# Patient Record
Sex: Female | Born: 1970
Health system: Southern US, Community
[De-identification: ages and names within clinical notes are randomized; demographics above are authoritative.]

## PROBLEM LIST (undated history)

## (undated) DIAGNOSIS — D649 Anemia, unspecified: Secondary | ICD-10-CM

## (undated) DIAGNOSIS — E282 Polycystic ovarian syndrome: Secondary | ICD-10-CM

## (undated) DIAGNOSIS — G43909 Migraine, unspecified, not intractable, without status migrainosus: Secondary | ICD-10-CM

---

## 2002-08-06 ENCOUNTER — Ambulatory Visit: Admission: RE | Admit: 2002-08-06 | Discharge: 2002-08-06 | Payer: Self-pay | Admitting: Gynecology

## 2002-08-18 ENCOUNTER — Ambulatory Visit (HOSPITAL_COMMUNITY): Admission: RE | Admit: 2002-08-18 | Discharge: 2002-08-18 | Payer: Self-pay | Admitting: Gastroenterology

## 2002-09-17 ENCOUNTER — Ambulatory Visit (HOSPITAL_COMMUNITY): Admission: RE | Admit: 2002-09-17 | Discharge: 2002-09-17 | Payer: Self-pay | Admitting: Gynecology

## 2002-09-17 ENCOUNTER — Encounter (INDEPENDENT_AMBULATORY_CARE_PROVIDER_SITE_OTHER): Payer: Self-pay | Admitting: *Deleted

## 2002-10-01 ENCOUNTER — Ambulatory Visit: Admission: RE | Admit: 2002-10-01 | Discharge: 2002-10-01 | Payer: Self-pay | Admitting: Gynecology

## 2002-10-14 ENCOUNTER — Ambulatory Visit: Admission: RE | Admit: 2002-10-14 | Discharge: 2002-10-14 | Payer: Self-pay | Admitting: Gynecology

## 2002-11-11 ENCOUNTER — Ambulatory Visit: Admission: RE | Admit: 2002-11-11 | Discharge: 2002-11-11 | Payer: Self-pay | Admitting: Gynecology

## 2010-11-05 ENCOUNTER — Emergency Department (HOSPITAL_BASED_OUTPATIENT_CLINIC_OR_DEPARTMENT_OTHER)
Admission: EM | Admit: 2010-11-05 | Discharge: 2010-11-05 | Disposition: A | Discharge status: Home / Self Care | Payer: Managed Care, Other (non HMO) | Attending: Emergency Medicine | Admitting: Emergency Medicine

## 2010-11-05 DIAGNOSIS — N390 Urinary tract infection, site not specified: Secondary | ICD-10-CM | POA: Insufficient documentation

## 2010-11-05 DIAGNOSIS — K589 Irritable bowel syndrome without diarrhea: Secondary | ICD-10-CM | POA: Insufficient documentation

## 2010-11-05 LAB — URINE MICROSCOPIC-ADD ON

## 2010-11-05 LAB — URINALYSIS, ROUTINE W REFLEX MICROSCOPIC
Hgb urine dipstick: NEGATIVE
Ketones, ur: NEGATIVE mg/dL
Protein, ur: NEGATIVE mg/dL
Urine Glucose, Fasting: NEGATIVE mg/dL
pH: 6.5 (ref 5.0–8.0)

## 2010-11-07 LAB — URINE CULTURE: Culture  Setup Time: 201202182233

## 2011-02-03 NOTE — Consult Note (Signed)
Victoria Salazar, Victoria Salazar                    ACCOUNT NO.:  [id number]   MEDICAL RECORD NO.:  [id number]                   PATIENT TYPE:  OUT   LOCATION:  GYN                                  FACILITY:  [facility]   PHYSICIAN:  De Blanch, M.D.         DATE OF BIRTH:  Feb 06, 1971   DATE OF CONSULTATION:  08/06/2002  DATE OF DISCHARGE:                                   CONSULTATION   REASON FOR CONSULTATION:  A 40 year old, white, married female seen in  consultation at the request of Dr. Clint Lipps of [place], [place].   The patient had a vaginal delivery of a 9 pound infant on April 03, 2002. At  that time, she had a fourth degree laceration which was repaired in the  standard fashion. Subsequently at her six week postpartum visit, however,  the patient noted passage of gas from her vagina and occasional small amount  of stool. This has persisted to the present time despite conservative  management in hopes of having this heal spontaneously. The patient presents  today for consultation regarding management options.   Currently the patient does leak flatus from the vagina and also leaks liquid  stool. Unfortunately she has irritable bowel syndrome and currently has a  considerable amount of diarrhea. The diarrhea was even worse during the  pregnancy. She does have a past history of undergoing an evaluation by a  gastroenterologist approximately five years ago where a diagnosis of  irritable bowel syndrome was made.   The patient denies any past gynecologic history and denies any diagnosis of  Crohns disease or ulcerative colitis.   PAST SURGICAL HISTORY:  None.   ALLERGIES:  SULFA.   CURRENT MEDICATIONS:  Oral contraceptives.   FAMILY HISTORY:  Negative for breast, ovarian or colon cancer.   GYNECOLOGIC HISTORY:  Gravida 1.   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 She does not smoke, drinks alcohol  on occasion.   REVIEW OF SYMPTOMS:  Negative except as noted above. The patient  specifically denies any abdominal pain or pressure, fever or chills,  cardiovascular, pulmonary, musculoskeletal, or GU symptoms.   PHYSICAL EXAMINATION:  Height 5 foot 4, weight 134 pounds. GENERAL:  The  patient is a healthy white female in no acute distress. HEENT:  NEGATIVE.  NECK:  Supple without thyromegaly. There is no supraclavicular or inguinal  adenopathy. ABDOMEN:  Soft, nontender, no mass, organomegaly, ascites, or  hernias are noted. PELVIC;  EGBUS is normal. The vagina has liquid stool  within it. The cervix is parous and normal. The uterus is anterior, normal  shape, size and consistency. There are no adnexal masses noted.   Careful inspection of the posterior vagina reveals that the rectovaginal  septum is relatively thin. With retraction of the anterior vagina and  visualization of the posterior vagina, a 2-3 mm fistula tract is noted  approximately 2 cm from  the introitus. A wire probe is easily passed through  this tract. The anal sphincter seems to be intact.   IMPRESSION:  Rectovaginal fistula following obstetrical laceration.   PLAN:  I would recommend the patient undergo repeat gastroenterology  consultation to rule out Crohns disease and ulcerative colitis. It these  were excluded then I think proceeding with surgical repair would be very  appropriate. The risks of surgery including bleeding, infection, and  breakdown of the repair with repeat fistulization were all outlined to the  patient. The surgical technique options were also outlined and depending  upon ease of access, we may or may not require transection of the anal  sphincter. Clearly at the time of repair, reinforcement and building up of  the rectovaginal septum will be important. We will arrange for the patient  to have GI consultation and then follow-up thereafter.                                                De Blanch, M.D.    DC/MEDQ  D:  08/06/2002  T:  08/06/2002  Job:  [id number]   cc:   Clint Lipps, M.D.  [facility]  [address] [place], [place]. [address]   Telford Nab, R.N.  [address] [place], [place] [address]  Fax: 1

## 2011-02-03 NOTE — Consult Note (Signed)
   NAMEBAO, COREAS                    ACCOUNT NO.:  [id number]   MEDICAL RECORD NO.:  [id number]                   PATIENT TYPE:  OUT   LOCATION:  GYN                                  FACILITY:  [facility]   PHYSICIAN:  De Blanch, M.D.         DATE OF BIRTH:  11-20-1970   DATE OF CONSULTATION:  10/01/2002  DATE OF DISCHARGE:                                   CONSULTATION   A 40 year old white female returns for initial postoperative follow-up now  approximately two weeks since repair of rectovaginal fistula and  reconstruction of a disrupted anal sphincter.   The patient reports that overall she has done well.  She is more active in  the last two days and has slight increased amount of perianal pain.  Her  bowel movements are fine.  She is having no constipation.  She has total  continence of stool and flatus.  She has returned to basically her standard  level of activity except for exercise.  She denies any fever or chills or  any vaginal or rectal drainage, discharge, or bleeding.   PHYSICAL EXAMINATION:  ABDOMEN:  Soft, nontender.  No mass, organomegaly,  ascites, or hernias are noted.  PELVIC:  EGBUS shows the midline perineal incision is well healed.  There is  no granulation tissue.  Inspection of the posterior vaginal wall shows some  suture material still intact.  There is no breakdown.  Palpation of the  posterior vagina reveals postoperative induration which seems appropriate at  this juncture without any point tenderness.  Rectal examination is deferred  at this point.   IMPRESSION:  Good postoperative recovery now two weeks following repair.  The patient will continue taking stool softeners for at least the next  month.  She is encouraged to use sitz baths a little more often to help  relieve some of the discomfort in the anal area.   I had a lengthy discussion with the patient regarding the procedure that we  performed, outlining both the repair of  the fistula as well as the anal  sphincter.  All of her questions are answered.  She will return to see me in  approximately four weeks for continuing postoperative follow-up or p.r.n.                                               De Blanch, M.D.    DC/MEDQ  D:  10/01/2002  T:  10/01/2002  Job:  [id number]   cc:   Telford Nab, R.N.  [address] [place], [place] [address]  Fax: 1   Clint Lipps, M.D.  [facility] [address] [phone]

## 2011-02-03 NOTE — Op Note (Signed)
NAMECHASEY, DULL                    ACCOUNT NO.:  [id number]   MEDICAL RECORD NO.:  [id number]                   PATIENT TYPE:  AMB   LOCATION:  ENDO                                 [facility]ACILITY:  [facility]   PHYSICIAN:  Danise Edge, M.D.                DATE OF BIRTH:  [birth year]   DATE OF PROCEDURE:  08/18/2002  DATE OF DISCHARGE:                                 OPERATIVE REPORT   PROCEDURE INDICATION:  Ms. Victoria Salazar is a 40 year old female born  [birth year].  Ms. Victoria Salazar developed a rectovaginal fistula following  the vaginal delivery of her first child.  She is undergoing a preoperative  colonoscopy to rule out inflammatory bowel disease.   Ms. Victoria Salazar did undergo a diagnostic colonoscopy on July 21, 1996, to  evaluate functional-type diarrhea.  Proctocolonoscopy to the cecum was  normal endoscopically, including visualization of the distal ileum.  Random  colonic biopsies were performed and were consistent with lymphocytic colitis  but not inflammatory bowel disease.   MEDICATION ALLERGIES:  SULFA.   ENDOSCOPIST:  Danise Edge, M.D.   PREMEDICATION:  Versed 10 mg, Demerol 75 mg.   ENDOSCOPE:  Olympus pediatric colonoscope.   PROCEDURE:  After obtaining an informed consent, Ms. Victoria Salazar was placed in  the left lateral decubitus position.  I administered intravenous Demerol and  intravenous Versed to achieve conscious sedation throughout the procedure.  The patient's blood pressure, oxygen saturation, and cardiac rhythm were  monitored throughout the procedure and documented in the medical record.   Anal inspection revealed a small, nonthrombosed external hemorrhoid.  Digital rectal exam was normal.  There were no signs of perianal  inflammatory bowel disease or perianal dermatitis.  The Olympus pediatric  colonoscope was introduced into the rectum and advanced to the cecum.  The  ileocecal valve was intubated and the distal ileum inspected.  Colonic  preparation for the examination today was excellent.   Rectum:  The rectal mucosa appears normal.  There is a fistula opening at  the dentate line.  The fistula opening small, and there is no surround  inflammation.   Sigmoid Colon and Descending Colon:  Normal.   Splenic Flexure:  Normal.   Transverse Colon:  Normal.   Hepatic Flexure:  Normal.   Ascending Colon:  Normal.   Cecum and Ileocecal Valve:  Normal.   Distal Ileum:  Normal.   ASSESSMENT:  A fistula opening is noted at the dentate line and is  consistent with a fistula secondary to vaginal delivery.  Otherwise, normal  proctocolonoscopy to the cecum with visualization of the distal ileum.  No  signs for the presence of inflammatory bowel disease.                                               Daphine Deutscher  Victoria Salazar, M.D.    MJ/MEDQ  D:  08/18/2002  T:  08/18/2002  Job:  [id number]   cc:   De Blanch, M.D.  [address]  [place]  [place] [address]  Fax: 1

## 2011-02-03 NOTE — Consult Note (Signed)
NAMEWHITTNEY, Victoria Salazar                    ACCOUNT NO.:  [id number]   MEDICAL RECORD NO.:  [id number]                   PATIENT TYPE:  OUT   LOCATION:  GYN                                  FACILITY:  [facility]   PHYSICIAN:  De Blanch, M.D.         DATE OF BIRTH:  06-26-1971   DATE OF CONSULTATION:  10/14/2002  DATE OF DISCHARGE:                                   CONSULTATION   HISTORY OF PRESENT ILLNESS:  The patient is a 40 year old white female who  returns for continuing follow-up of rectovaginal fistula.  She had her  initial repair on September 17, 2002.  She developed incontinence of stool  approximately two weeks postoperatively.  She has continued to have  incontinence of stool since that time.  I have examined her on two occasions  and have been unable to fully document a fistula, although the patient is  quite certain she has a fistula.  She denies any fever or chills.   Last week she had diarrhea which she associates with having had a viral  infection.  She also notes that she has irritable bowel syndrome and,  therefore, has diarrhea periodically anyway.   Today she presents with a perineal pad which has stool on it.  Her only  other symptom is that she has some leakage of stool after she has a bowel  movement.   PHYSICAL EXAMINATION:  ABDOMEN:  Soft and nontender.  No masses,  organomegaly, ascites, or hernias are noted.  PELVIC:  EGBUS looks normal although there is some stool on the vulva.  Inspection of the vagina reveals minimal discharge and no apparent stool.  Using the speculum I carefully inspected the suture line in the posterior  vagina which seems to be intact.  Some Vicryl sutures still remain in place.  The perineum is normal.  RECTAL:  Using a small finger does not reveal any evidence of a fistula, and  the perineal body, anal sphincter, and lower vagina all appear to be intact.   IMPRESSION:  Status post repair of rectovaginal fistula  September 17, 2002,  now with incontinence of stool.  While the patient feels strongly that she  has a recurrence of her fistula (which she may well have), I am unable to  document this on my exam.  We will continue to observe the patient  carefully.  Certainly, if she does have a fistula it would be too early to  intervene.  The patient has a number of questions regarding potential repair  in the future, fecal  diversion, etc.  These are answered.  We will plan on seeing the patient  back again in three weeks for continuing follow-up.   The patient was given a prescription for Darvocet-N 100.  De Blanch, M.D.    DC/MEDQ  D:  10/14/2002  T:  10/14/2002  Job:  [id number]   cc:   Telford Nab, R.N.  [address] [place], [place] [address]  Fax: 1

## 2011-02-03 NOTE — Op Note (Signed)
NAMEJOHNI, NARINE                    ACCOUNT NO.:  [id number]   MEDICAL RECORD NO.:  [id number]                   PATIENT TYPE:  AMB   LOCATION:  DAY                                  FACILITY:  [facility]   PHYSICIAN:  De Blanch, M.D.         DATE OF BIRTH:  December 15, 1970   DATE OF PROCEDURE:  09/17/2002  DATE OF DISCHARGE:                                 OPERATIVE REPORT   PREOPERATIVE DIAGNOSIS:  Rectovaginal fistula.   POSTOPERATIVE DIAGNOSES:  Rectovaginal fistula and disrupted anal sphincter.   PROCEDURE:  Vaginal repair of rectovaginal fistula and reconstruction of  anal sphincter and perineum.   SURGEON:  De Blanch, M.D.   ASSISTANT:  Telford Nab, R.N.   ANESTHESIA:  General with orotracheal tube.   ESTIMATED BLOOD LOSS:  30 cc.   SURGICAL FINDINGS:  At the time of examination under anesthesia, the patient  was noted to have a rectovaginal fistula approximately 6 mm in diameter,  arising at the dentate line and extending through the lowest portion of the  vagina just inside the introitus.  It was also noted the patient had a  disruption of the anal sphincter just distal to this fistula.  The remainder  of the vagina and cervix appeared normal.   DESCRIPTION OF PROCEDURE:  The patient was brought to the operating room and  after satisfactory attainment of general anesthesia, was placed in lithotomy  position in candy-cane stirrups.  The vulva, vagina, and anus were prepped  with Betadine, and the patient was draped.  The patient was examined with  the above-noted findings.  A wire probe was placed in the rectovaginal  fistula, exiting the vagina and anus.  Because the sphincter was disrupted  and needed to be repaired, the perineum was divided in the midline up to and  including the fistula.  The fistula tract was excised along with some  additional scar tissue.  The anterior vagina was then mobilized off of the  rectum and endopelvic  fascia.  This was dissected cephalad approximately 7  cm.  Dissection then moved lateral to the rectum bilaterally.  Further  dissection of the perineum was required in order to release scar tissue and  identify the anal sphincter.  Once the sphincter was identified on either  end, it was grasped with a Kelly clamp, and further dissection of the  capsule of the sphincter was carried out until it was very mobile and easily  brought together in the midline.  The dissection along the rectal mucosa  excised remaining scar tissue and fistula tract.   Repair was then undertaken, closing the rectal mucosa with interrupted  sutures of 3-0 Vicryl with a knot placed in the lumen.  These sutures were  placed until we reached the external anal sphincter.  A second layer of  lamina propria was then brought over the rectal mucosal repair with  interrupted 3-0 Vicryl sutures.  The levator plate was then reapproximated  across  the rectum with interrupted 2-0 Vicryl sutures as in a posterior  colporrhaphy.  This was carried down to the introitus.  The anal sphincter  was then reapproximated with interrupted sutures of 2-0 Vicryl.  These were  all tied, resulting in good approximation of the anal sphincter.  Rectal  exam was performed, revealing adequate patency of the rectum.  The vagina  was then closed after scarring in the vagina was excised in a longitudinal  fashion with interrupted sutures of 2-0 Vicryl.  Once we reached the  introitus, the remainder of the perineal skin was closed with a running  subcuticular suture of 3-0 chromic.  The vulva and vagina was reinspected,  the vagina palpated, and rectal exam performed.  No sutures were identified  in the rectum, and good integrity of the perineal body and lower  rectovaginal septum was identified.   The perineum and vagina were irrigated; ice pack was placed on the perineum  and anus, and the patient was transferred to the recovery room in   satisfactory condition.  Sponge, needle, and instrument counts were correct  x 2.                                               De Blanch, M.D.    DC/MEDQ  D:  09/17/2002  T:  09/17/2002  Job:  [id number]   cc:   Telford Nab, R.N.  [address] [place], [place] [address]  Fax: 1   Clint Lipps, M.D.  [facility]  [address]  [place], [place]  [address]

## 2011-02-03 NOTE — Consult Note (Signed)
Victoria Salazar, Victoria Salazar                    ACCOUNT NO.:  [id number]   MEDICAL RECORD NO.:  [id number]                   PATIENT TYPE:  OUT   LOCATION:  GYN                                  FACILITY:  [facility]   PHYSICIAN:  De Blanch, M.D.         DATE OF BIRTH:  10-12-1970   DATE OF CONSULTATION:  11/11/2002  DATE OF DISCHARGE:                                   CONSULTATION   REASON FOR CONSULTATION:  A 40 year old white female returns for continuing  followup of rectovaginal fistula. She underwent repair on September 17, 2002.  Approximately two weeks following repair, she noted incontinence of stool  which has persisted at the present time. She denies any fever, chills or any  perianal pain. She notes that she has approximately four loose stools a day  and following each stool has drainage from what she believes is the vagina.  She denies any blood in the stool. The stools are not formed and she reports  that she has had colitis forever. She did have a preoperative evaluation  by Dr. Danise Edge and no evidence of Crohns disease or ulcerative  colitis was identified.   The patient is obviously distressed by continued incontinence of stool and  indicates that she has sought a second opinion. In addition, she has been  getting information from the Internet. We have not had any communication  with the physician offering the second opinion nor have we had any request  for medical records from the patient or any other physician to date.   With regard to the patient's diarrhea, she indicates that she has tried  Imodium in the past but that this made her bloated but that no other medical  management has been pursued through her gastroenterologist.   REVIEW OF SYMPTOMS:  Reveals the patient has no GU symptoms, has no  cardiovascular, pulmonary, musculoskeletal, or neurologic symptoms.   SOCIAL HISTORY:  She is working full time and has an 28 month old baby.   PHYSICAL EXAMINATION:  VITAL SIGNS:  Weight 125 pounds, blood pressure  125/70.  GENERAL:  The patient is a healthy white female in no acute distress.  HEENT:  Reveals some thinning of her hair.  ABDOMEN:  Soft, nontender. No masses, organomegaly, ascites or hernias are  noted.  PELVIC:  EGBUS appears normal. The vagina is inspected carefully. First of  all, there is no stool in the vagina. There is some menstrual blood coming  from the cervix, this is easily cleaned away and the vagina is inspected  carefully especially with attention to the posterior wall of the vagina. The  sutures which were previously present on last exam are now absorbed and the  vaginal incision is well healed. I am unable to identify any fistulous  tract. The perineum is intact. Rectal exam with visualization of the vagina  does not reveal any evidence of a fistula nor do I find any mucus or other  material extruding  into the vagina when I palpate and massage the  rectovaginal septum. The anal sphincter seems to be intact.   Gross neurologic testing of the perineum reveals that there is no anal wink.   IMPRESSION:  Rectovaginal fistula from an obstetrical delivery which has  been repaired on September 17, 2002. The patient has persistent incontinence  which she feels quite certain is reoccurrence of her fistula. At the present  time, I am unable to identify any fistula and therefore must recommend the  patient continue to be evaluated. I would recommend that she undergo  examination under anesthesia with anoscopy and proctoscopy to see whether we  can further define the anatomy in this region. I offered the patient to  undergo this procedure anytime in the future at her convenience. At the  present time, she does not wish to pursue this with me indicating that she  is seeking other opinions as to management. I did offer her the suggestion  that we could give her names of competent colorectal surgeons to evaluate   her if she would like but she declined this offer. We did copy her medical  records and give them to her so that she could share them with other  physicians providing a second opinion. At this time, the patient does not  wish to have any return appointments scheduled in our office but we  indicated we would be happy to see her at her request.                                               De Blanch, M.D.    DC/MEDQ  D:  11/12/2002  T:  11/12/2002  Job:  [id number]   cc:   Birdie Riddle, M.D.  [facility]  [address], [place], [place] [address]   Telford Nab, R.N.   Danise Edge, M.D.  [address]  [place]  [place] [address]  Fax: [phone]

## 2012-03-29 ENCOUNTER — Emergency Department (HOSPITAL_BASED_OUTPATIENT_CLINIC_OR_DEPARTMENT_OTHER)
Admission: EM | Admit: 2012-03-29 | Discharge: 2012-03-29 | Disposition: A | Discharge status: Home / Self Care | Payer: Managed Care, Other (non HMO) | Attending: Emergency Medicine | Admitting: Emergency Medicine

## 2012-03-29 ENCOUNTER — Encounter (HOSPITAL_BASED_OUTPATIENT_CLINIC_OR_DEPARTMENT_OTHER): Payer: Self-pay | Admitting: *Deleted

## 2012-03-29 DIAGNOSIS — G43909 Migraine, unspecified, not intractable, without status migrainosus: Secondary | ICD-10-CM

## 2012-03-29 HISTORY — DX: Polycystic ovarian syndrome: E28.2

## 2012-03-29 HISTORY — DX: Migraine, unspecified, not intractable, without status migrainosus: G43.909

## 2012-03-29 MED ORDER — DIPHENHYDRAMINE HCL 50 MG/ML IJ SOLN
25.0000 mg | Freq: Once | INTRAMUSCULAR | Status: AC
Start: 2012-03-29 — End: 2012-03-29
  Administered 2012-03-29: 25 mg via INTRAVENOUS
  Filled 2012-03-29: qty 1

## 2012-03-29 MED ORDER — PROMETHAZINE HCL 25 MG PO TABS: 25.0000 mg | ORAL_TABLET | Freq: Four times a day (QID) | ORAL | Status: DC | PRN

## 2012-03-29 MED ORDER — PROMETHAZINE HCL 25 MG/ML IJ SOLN
25.0000 mg | Freq: Once | INTRAMUSCULAR | Status: AC
  Administered 2012-03-29: 25 mg via INTRAVENOUS
  Filled 2012-03-29: qty 1

## 2012-03-29 MED ORDER — KETOROLAC TROMETHAMINE 30 MG/ML IJ SOLN
30.0000 mg | Freq: Once | INTRAMUSCULAR | Status: AC
  Administered 2012-03-29: 30 mg via INTRAVENOUS
  Filled 2012-03-29: qty 1

## 2012-03-29 NOTE — ED Notes (Signed)
Sprite given to pt at this time.  

## 2012-03-29 NOTE — ED Notes (Signed)
Pt reports no more dizziness and pain is much better resting comfortable now.

## 2012-03-29 NOTE — ED Notes (Signed)
Sitting up in bed drinking sprite talking with visitor reports feeling better

## 2012-03-29 NOTE — ED Provider Notes (Signed)
History     CSN: [id number]  Arrival date & time 03/29/12  1230   First MD Initiated Contact with Patient 03/29/12 1313      Chief Complaint  Patient presents with  . Migraine    (Consider location/radiation/quality/duration/timing/severity/associated sxs/prior treatment) HPI Comments: Pt with long h/o HA's, presumed to be migraines, chose never to take tryptans due to h/o heart disease in family, had tried preventative meds in the past with no relief, so she takes OTC meds and "toughs them out." typically.  She had gradual onset of worsening HA over the past 1.5 days, today vise like grip around.  Yesterday thought she was coming down with a flu with aches, chills, stiffness to neck.  More severe than typical so came to the ED.  No trauma, no actual fever, no N/V/D.  She has some photophobia like prior HA's.  No sinus drainage, congestion.    Patient is a 41 y.o. female presenting with migraine. The history is provided by the patient and a relative.  Migraine Associated symptoms include headaches. Pertinent negatives include no abdominal pain and no shortness of breath.    Past Medical History  Diagnosis Date  . Migraine   . PCOS (polycystic ovarian syndrome)     History reviewed. No pertinent past surgical history.  History reviewed. No pertinent family history.  History  Substance Use Topics  . Smoking status: Never Smoker   . Smokeless tobacco: Not on file  . Alcohol Use: No    OB History    Grav Para Term Preterm Abortions TAB SAB Ect Mult Living                  Review of Systems  Constitutional: Positive for chills. Negative for fever.  HENT: Positive for neck stiffness. Negative for congestion and sinus pressure.   Eyes: Positive for photophobia. Negative for visual disturbance.  Respiratory: Negative for cough and shortness of breath.   Gastrointestinal: Negative for nausea, vomiting, abdominal pain and diarrhea.  Musculoskeletal: Negative for back pain.    Neurological: Positive for headaches. Negative for dizziness, weakness and numbness.    Allergies  Sulfa antibiotics  Home Medications   Current Outpatient Rx  Name Route Sig Dispense Refill  . METFORMIN HCL 1000 MG PO TABS Oral Take 1,000 mg by mouth 2 (two) times daily with a meal.    . PROMETHAZINE HCL 25 MG PO TABS Oral Take 1 tablet (25 mg total) by mouth every 6 (six) hours as needed for nausea. 20 tablet 0    BP 121/71  Pulse 110  Temp 98.4 F (36.9 C)  Resp 20  Ht 5' 4.5" (1.638 m)  Wt 142 lb (64.411 kg)  BMI 24.00 kg/m2  SpO2 100%  LMP 03/08/2012  Physical Exam  Nursing note and vitals reviewed. Constitutional: She is oriented to person, place, and time. She appears well-developed and well-nourished.  HENT:  Head: Normocephalic and atraumatic.  Eyes: Pupils are equal, round, and reactive to light.  Neck: Normal range of motion and phonation normal. Neck supple. No rigidity. No tracheal deviation and normal range of motion present. No Brudzinski's sign and no Kernig's sign noted.  Cardiovascular: Normal rate.   Pulmonary/Chest: Effort normal and breath sounds normal. No stridor. No respiratory distress.  Abdominal: Soft.  Neurological: She is alert and oriented to person, place, and time. She has normal strength. No cranial nerve deficit. Coordination normal.  Skin: Skin is warm.    ED Course  Procedures (including critical  care time)  Labs Reviewed - No data to display No results found.   1. Migraine       MDM  IV HA cocktail with toradol, phenergan, benadryl given.  Pt rested for a brief while, reports HA is 50% improved and she feels like she would like to go home.  No fever, no rash, no stiff neck, non focal neuro exam.          Gavin Pound. Sarahlynn Cisnero, MD 03/29/12 1526

## 2012-03-29 NOTE — ED Notes (Signed)
Migraine started last night progressively worse today describes as feeling like a vice is squeezing her head and back of neck pt also has body aches that started this morning. Denies nausea or vomiting denies visual disturbances

## 2017-03-20 ENCOUNTER — Emergency Department (HOSPITAL_BASED_OUTPATIENT_CLINIC_OR_DEPARTMENT_OTHER)
Admission: EM | Admit: 2017-03-20 | Discharge: 2017-03-20 | Disposition: A | Discharge status: Home / Self Care | Payer: BLUE CROSS/BLUE SHIELD | Attending: Emergency Medicine | Admitting: Emergency Medicine

## 2017-03-20 ENCOUNTER — Encounter (HOSPITAL_BASED_OUTPATIENT_CLINIC_OR_DEPARTMENT_OTHER): Payer: Self-pay | Admitting: *Deleted

## 2017-03-20 ENCOUNTER — Emergency Department (HOSPITAL_BASED_OUTPATIENT_CLINIC_OR_DEPARTMENT_OTHER): Payer: BLUE CROSS/BLUE SHIELD

## 2017-03-20 DIAGNOSIS — Z7984 Long term (current) use of oral hypoglycemic drugs: Secondary | ICD-10-CM | POA: Insufficient documentation

## 2017-03-20 DIAGNOSIS — R1031 Right lower quadrant pain: Secondary | ICD-10-CM | POA: Diagnosis present

## 2017-03-20 DIAGNOSIS — Z79899 Other long term (current) drug therapy: Secondary | ICD-10-CM | POA: Diagnosis not present

## 2017-03-20 DIAGNOSIS — N200 Calculus of kidney: Secondary | ICD-10-CM | POA: Diagnosis not present

## 2017-03-20 HISTORY — DX: Anemia, unspecified: D64.9

## 2017-03-20 LAB — CBC WITH DIFFERENTIAL/PLATELET
BASOS ABS: 0 10*3/uL (ref 0.0–0.1)
BASOS PCT: 0 %
EOS PCT: 2 %
Eosinophils Absolute: 0.1 10*3/uL (ref 0.0–0.7)
HEMATOCRIT: 29.3 % — AB (ref 36.0–46.0)
HEMOGLOBIN: 8.8 g/dL — AB (ref 12.0–15.0)
LYMPHS PCT: 30 %
Lymphs Abs: 1.8 10*3/uL (ref 0.7–4.0)
MCH: 21.9 pg — AB (ref 26.0–34.0)
MCHC: 30 g/dL (ref 30.0–36.0)
MCV: 73.1 fL — AB (ref 78.0–100.0)
MONOS PCT: 7 %
Monocytes Absolute: 0.4 10*3/uL (ref 0.1–1.0)
NEUTROS ABS: 3.6 10*3/uL (ref 1.7–7.7)
Neutrophils Relative %: 61 %
Platelets: 210 10*3/uL (ref 150–400)
RBC: 4.01 MIL/uL (ref 3.87–5.11)
RDW: 18.4 % — ABNORMAL HIGH (ref 11.5–15.5)
WBC: 5.9 10*3/uL (ref 4.0–10.5)

## 2017-03-20 LAB — PREGNANCY, URINE: Preg Test, Ur: NEGATIVE

## 2017-03-20 LAB — URINALYSIS, ROUTINE W REFLEX MICROSCOPIC
BILIRUBIN URINE: NEGATIVE
GLUCOSE, UA: NEGATIVE mg/dL
Ketones, ur: NEGATIVE mg/dL
NITRITE: NEGATIVE
PH: 5 (ref 5.0–8.0)
Protein, ur: NEGATIVE mg/dL
SPECIFIC GRAVITY, URINE: 1.023 (ref 1.005–1.030)

## 2017-03-20 LAB — URINALYSIS, MICROSCOPIC (REFLEX)

## 2017-03-20 LAB — BASIC METABOLIC PANEL
ANION GAP: 8 (ref 5–15)
BUN: 13 mg/dL (ref 6–20)
CHLORIDE: 107 mmol/L (ref 101–111)
CO2: 24 mmol/L (ref 22–32)
Calcium: 8.7 mg/dL — ABNORMAL LOW (ref 8.9–10.3)
Creatinine, Ser: 0.87 mg/dL (ref 0.44–1.00)
GFR calc Af Amer: >60 mL/min (ref >60)
GFR calc non Af Amer: >60 mL/min (ref >60)
GLUCOSE: 104 mg/dL — AB (ref 65–99)
POTASSIUM: 3.6 mmol/L (ref 3.5–5.1)
Sodium: 139 mmol/L (ref 135–145)

## 2017-03-20 MED ORDER — KETOROLAC TROMETHAMINE 30 MG/ML IJ SOLN
30.0000 mg | Freq: Once | INTRAMUSCULAR | Status: AC
  Administered 2017-03-20: 30 mg via INTRAVENOUS
  Filled 2017-03-20: qty 1

## 2017-03-20 MED ORDER — ONDANSETRON HCL 4 MG/2ML IJ SOLN
4.0000 mg | Freq: Once | INTRAMUSCULAR | Status: AC
  Administered 2017-03-20: 4 mg via INTRAVENOUS
  Filled 2017-03-20: qty 2

## 2017-03-20 MED ORDER — MORPHINE SULFATE (PF) 4 MG/ML IV SOLN
4.0000 mg | Freq: Once | INTRAVENOUS | Status: AC
  Administered 2017-03-20: 4 mg via INTRAVENOUS
  Filled 2017-03-20: qty 1

## 2017-03-20 MED ORDER — OXYCODONE-ACETAMINOPHEN 5-325 MG PO TABS: 1.0000 | ORAL_TABLET | Freq: Four times a day (QID) | ORAL | 0 refills | Status: AC | PRN

## 2017-03-20 MED ORDER — ONDANSETRON 4 MG PO TBDP: 4.0000 mg | ORAL_TABLET | Freq: Three times a day (TID) | ORAL | 0 refills | Status: AC | PRN

## 2017-03-20 MED FILL — ONDANSETRON ODT 4 MG TABLET: 4 | 3 days supply | Qty: 8 | Fill #0

## 2017-03-20 MED FILL — OXYCOD/ACETAMINOPHEN 5-325M: 5-325 | 2 days supply | Qty: 10 | Fill #0

## 2017-03-20 NOTE — ED Triage Notes (Signed)
Pt reports awakening with right flank pain, "sharp and dull" around 5am. Pt reports pain was 10/10 en route, vomited in the car, now pain is 9/10.

## 2017-03-20 NOTE — Discharge Instructions (Signed)
Watch out for fevers. Follow up with either [facility] or your urologist.

## 2017-03-20 NOTE — ED Notes (Signed)
Patient transported to CT 

## 2017-03-20 NOTE — ED Notes (Signed)
ED Provider at bedside. 

## 2017-03-20 NOTE — ED Provider Notes (Addendum)
[facility] Provider Note   CSN: [id number] Arrival date & time: 03/20/17  0726     History   Chief Complaint Chief Complaint  Patient presents with  . Flank Pain    HPI Victoria Salazar is a 46 y.o. female.  HPI Patient developed right flank pain this morning. States she woke with it. Sharp dull. Worse with movements. Had been severe. Has had vomiting. No dysuria. Does not think she is pregnant. No history of kidney stones but states she thinks she has 1. No hematuria.. No relief with Tylenol.   Past Medical History:  Diagnosis Date  . Anemia   . Migraine   . PCOS (polycystic ovarian syndrome)     There are no active problems to display for this patient.   History reviewed. No pertinent surgical history.  OB History    No data available       Home Medications    Prior to Admission medications   Medication Sig Start Date End Date Taking? Authorizing Provider  escitalopram (LEXAPRO) 10 MG tablet Take 10 mg by mouth daily.   Yes [provider]  Fe Cbn-Fe Gluc-FA-B12-C-DSS (FERRALET 90 PO) Take by mouth.   Yes [provider]  metFORMIN (GLUCOPHAGE) 1000 MG tablet Take 1,000 mg by mouth 2 (two) times daily with a meal.    [provider]  ondansetron (ZOFRAN-ODT) 4 MG disintegrating tablet Take 1 tablet (4 mg total) by mouth every 8 (eight) hours as needed for nausea or vomiting. 03/20/17   Benjiman Core, MD  oxyCODONE-acetaminophen (PERCOCET/ROXICET) 5-325 MG tablet Take 1-2 tablets by mouth every 6 (six) hours as needed for severe pain. 03/20/17   Benjiman Core, MD    Family History No family history on file.  Social History Social History  Substance Use Topics  . Smoking status: Never Smoker  . Smokeless tobacco: Never Used  . Alcohol use No     Allergies   Sulfa antibiotics   Review of Systems Review of Systems  Constitutional: Negative for appetite change.  HENT: Negative for congestion.     Respiratory: Negative for shortness of breath.   Cardiovascular: Negative for leg swelling.  Gastrointestinal: Positive for nausea and vomiting. Negative for abdominal pain.  Genitourinary: Positive for flank pain. Negative for dysuria and enuresis.  Musculoskeletal: Negative for back pain and neck pain.  Neurological: Negative for seizures.  Hematological: Negative for adenopathy.  Psychiatric/Behavioral: Negative for confusion.     Physical Exam Updated Vital Signs BP 127/62 (BP Location: Left Arm)   Pulse 84   Temp 98.7 F (37.1 C) (Oral)   Resp 16   Ht 5\' 4"  (1.626 m)   Wt 70.3 kg (155 lb)   LMP 02/27/2017   SpO2 100%   BMI 26.61 kg/m   Physical Exam  Constitutional: She appears well-developed.  Eyes: EOM are normal.  Cardiovascular: Normal rate.   Pulmonary/Chest: Effort normal.  Abdominal: There is no tenderness.  Genitourinary:  Genitourinary Comments: Right CVA tenderness.  Musculoskeletal: She exhibits no edema.  Neurological: She is alert.  Skin: Skin is warm. Capillary refill takes less than 2 seconds.  Psychiatric: She has a normal mood and affect.     ED Treatments / Results  Labs (all labs ordered are listed, but only abnormal results are displayed) Labs Reviewed  BASIC METABOLIC PANEL - Abnormal; Notable for the following:       Result Value   Glucose, Bld 104 (*)    Calcium 8.7 (*)  All other components within normal limits  CBC WITH DIFFERENTIAL/PLATELET - Abnormal; Notable for the following:    Hemoglobin 8.8 (*)    HCT 29.3 (*)    MCV 73.1 (*)    MCH 21.9 (*)    RDW 18.4 (*)    All other components within normal limits  URINALYSIS, ROUTINE W REFLEX MICROSCOPIC - Abnormal; Notable for the following:    Hgb urine dipstick MODERATE (*)    Leukocytes, UA MODERATE (*)    All other components within normal limits  URINALYSIS, MICROSCOPIC (REFLEX) - Abnormal; Notable for the following:    Bacteria, UA FEW (*)    Squamous Epithelial / LPF  0-5 (*)    All other components within normal limits  PREGNANCY, URINE    EKG  EKG Interpretation None       Radiology Ct Renal Stone Study  Result Date: 03/20/2017 CLINICAL DATA:  Right flank pain EXAM: CT ABDOMEN AND PELVIS WITHOUT CONTRAST TECHNIQUE: Multidetector CT imaging of the abdomen and pelvis was performed following the standard protocol without IV contrast. COMPARISON:  None. FINDINGS: Lower chest:  Negative. Hepatobiliary: No focal liver abnormality.No evidence of biliary obstruction or stone. Pancreas: Unremarkable. Spleen: Unremarkable. Adrenals/Urinary Tract: Negative adrenals. Moderate right hydroureteronephrosis secondary to a 3 mm stone in the distal right ureter, within 2 cm of the UVJ. Two punctate right renal calculi. Mild right renal expansion and low-density. No left urolithiasis. Unremarkable bladder. Stomach/Bowel:  No obstruction. No appendicitis. Vascular/Lymphatic: No acute vascular abnormality. No mass or adenopathy. Reproductive:IUD in place. Presumed dominant follicle on the left. Myometrial mass at the fundus measuring 3 cm . Other: No ascites or pneumoperitoneum. Musculoskeletal: No acute abnormalities.  L4-5 disc bulging. IMPRESSION: 1. Obstructing 3 mm stone in the distal right ureter. 2. Two punctate right renal calculi. 3. 3 cm fundal fibroid. Electronically Signed   By: Marnee SpringJonathon  Watts M.D.   On: 03/20/2017 08:30    Procedures Procedures (including critical care time)  Medications Ordered in ED Medications  ondansetron (ZOFRAN) injection 4 mg (4 mg Intravenous Given 03/20/17 0747)  morphine 4 MG/ML injection 4 mg (4 mg Intravenous Given 03/20/17 0749)  morphine 4 MG/ML injection 4 mg (4 mg Intravenous Given 03/20/17 0821)  ketorolac (TORADOL) 30 MG/ML injection 30 mg (30 mg Intravenous Given 03/20/17 0829)     Initial Impression / Assessment and Plan / ED Course  I have reviewed the triage vital signs and the nursing notes.  Pertinent labs & imaging  results that were available during my care of the patient were reviewed by me and considered in my medical decision making (see chart for details).     Patient with right flank pain. Has relatively small distal ureteral stone. Pain improved. Will discharge home. Has white cells and rare bacteria in the urine. No fevers. Instructed to watch out for fevers or worsening symptoms. Will follow-up if he was [facility] or [facility] since she works for [employer]  Final Clinical Impressions(s) / ED Diagnoses   Final diagnoses:  Kidney stone    New Prescriptions New Prescriptions   ONDANSETRON (ZOFRAN-ODT) 4 MG DISINTEGRATING TABLET    Take 1 tablet (4 mg total) by mouth every 8 (eight) hours as needed for nausea or vomiting.   OXYCODONE-ACETAMINOPHEN (PERCOCET/ROXICET) 5-325 MG TABLET    Take 1-2 tablets by mouth every 6 (six) hours as needed for severe pain.     Benjiman CorePickering, Quinlee Sciarra, MD 03/20/17 [phone]  Patient is anemic with history of anemia. Recently started  on iron by her primary care doctor.    Benjiman Core, MD 03/20/17 [phone]

## 2018-07-15 IMAGING — CT CT RENAL STONE PROTOCOL
2 of 4 series · 16 of 46 positions shown, 18 images · non-contrast
Comparison: None.

CLINICAL DATA: Right flank pain

EXAM:
CT ABDOMEN AND PELVIS WITHOUT CONTRAST
TECHNIQUE: Multidetector CT imaging of the abdomen and pelvis was performed
following the standard protocol without IV contrast.

[Series 2: axial st · axial · 0.81mm/px · z∈[-476,-46]mm · 13 of 94 slices shown, 15 images]
[im 4/94  soft-tissue]
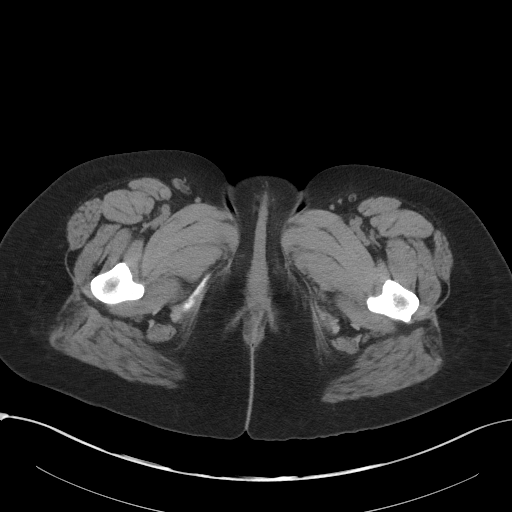
[im 4/94  bone]
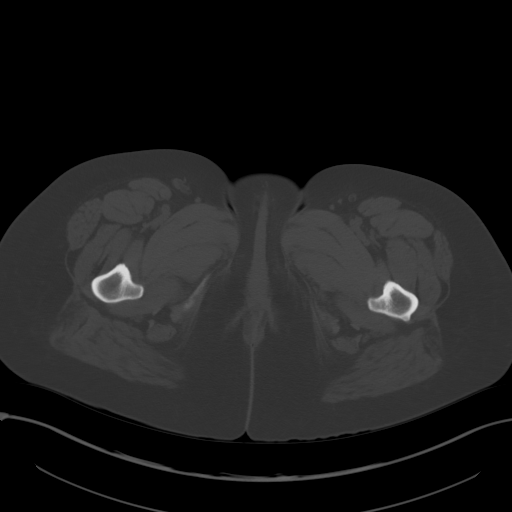
[im 12/94  soft-tissue]
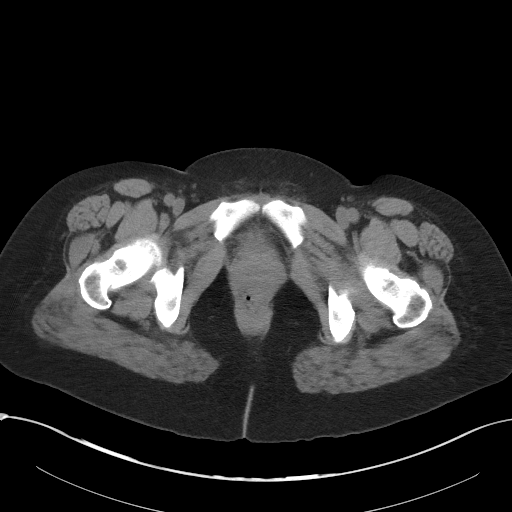
[im 20/94  soft-tissue]
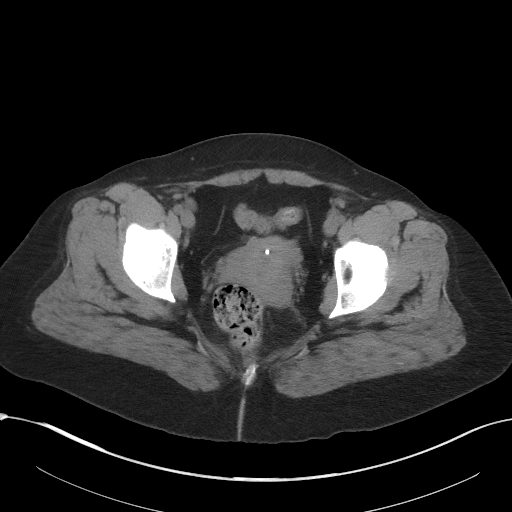
[im 28/94  soft-tissue]
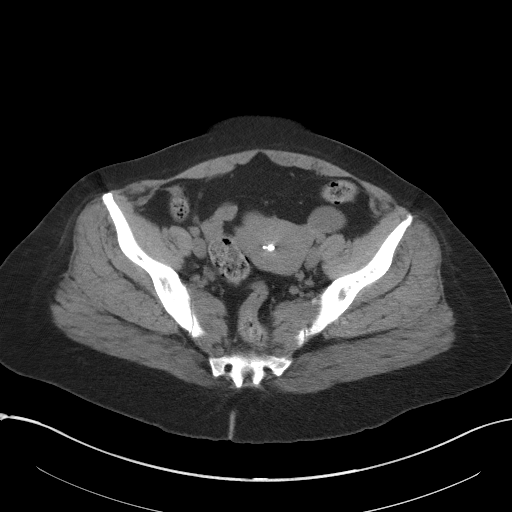
[im 32/94  soft-tissue]
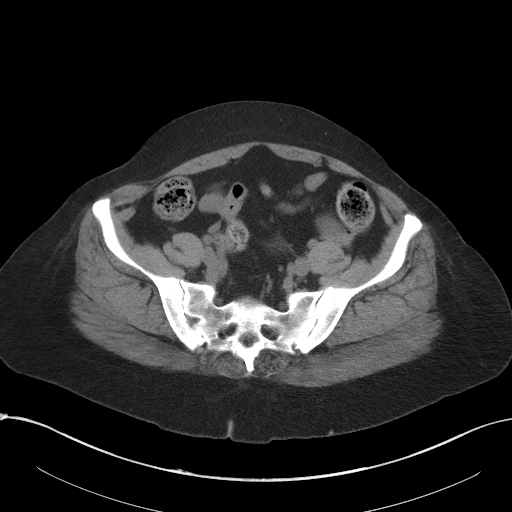
[im 39/94  soft-tissue]
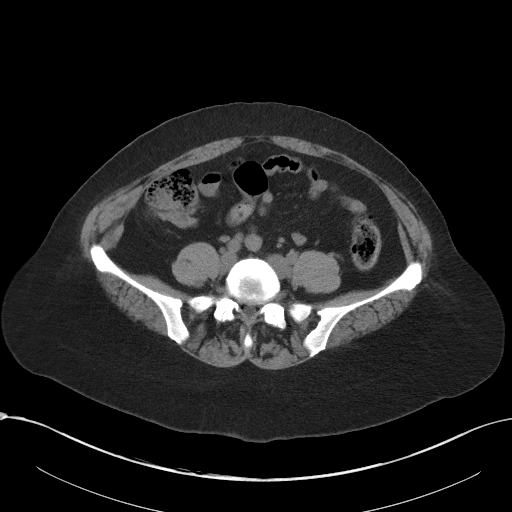
[im 47/94  soft-tissue]
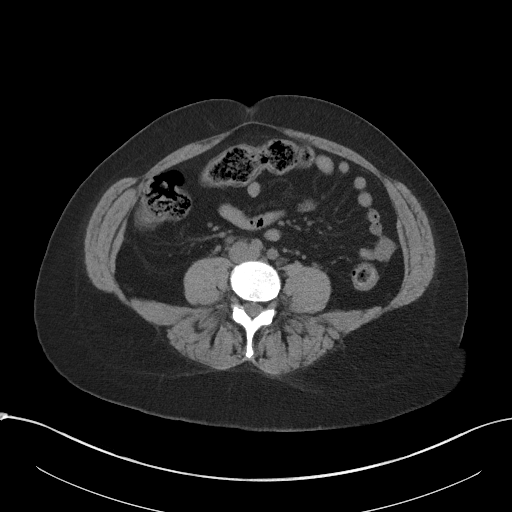
[im 55/94  soft-tissue]
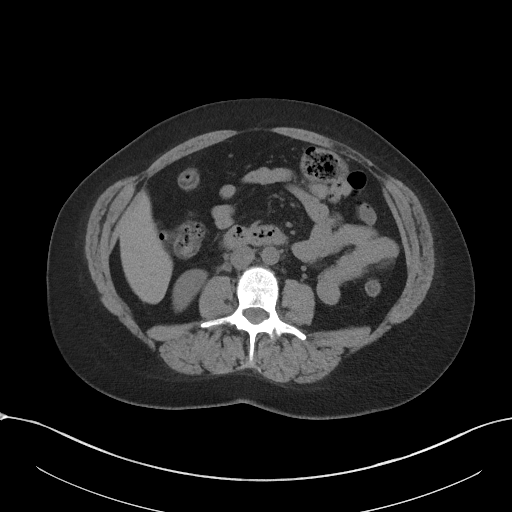
[im 63/94  soft-tissue]
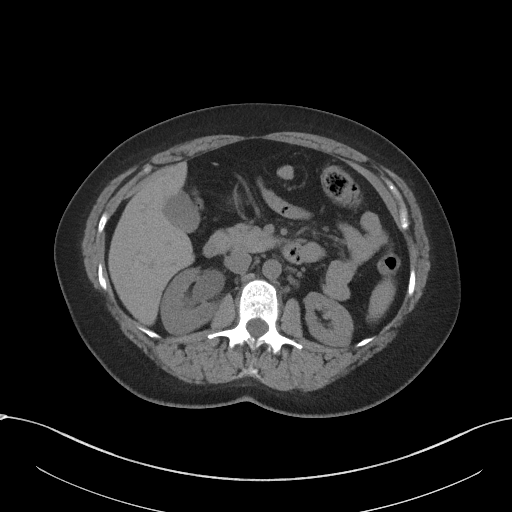
[im 63/94  bone]
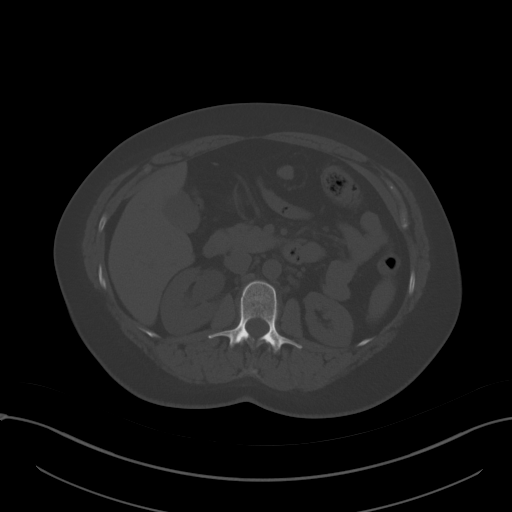
[im 66/94  soft-tissue]
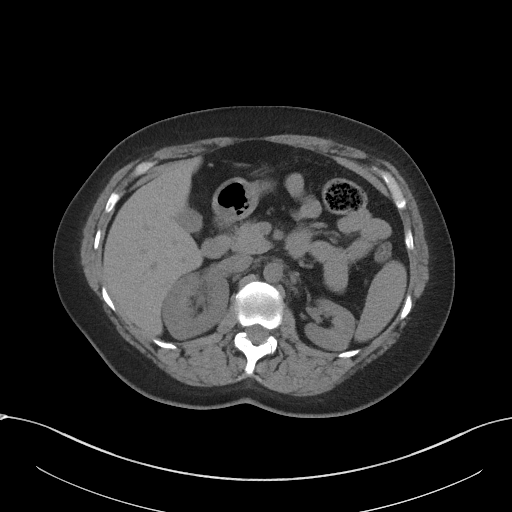
[im 74/94  soft-tissue]
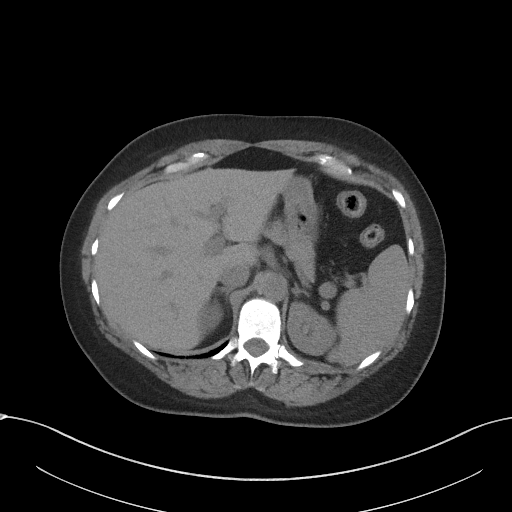
[im 82/94  soft-tissue]
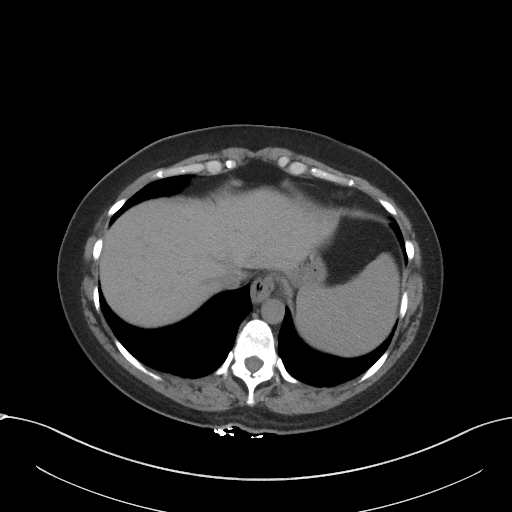
[im 90/94  soft-tissue]
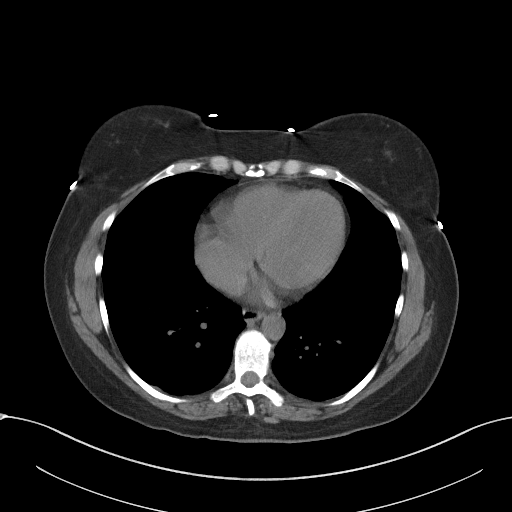

[Series 5: coronal st · coronal · 0.74mm/px · 3 of 101 slices shown]
[im 34/101  soft-tissue]
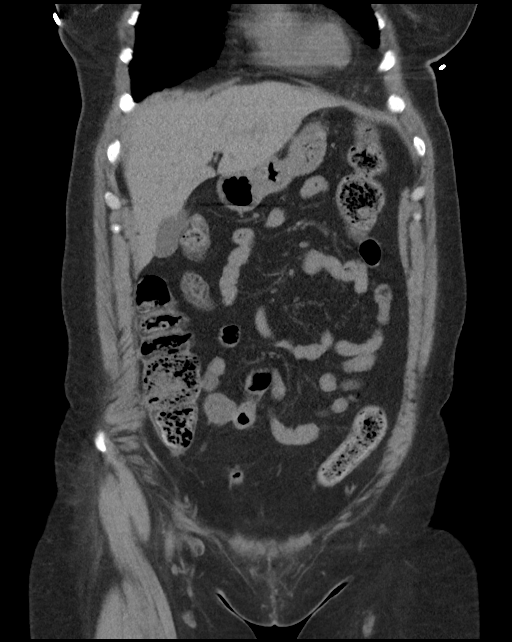
[im 45/101  soft-tissue]
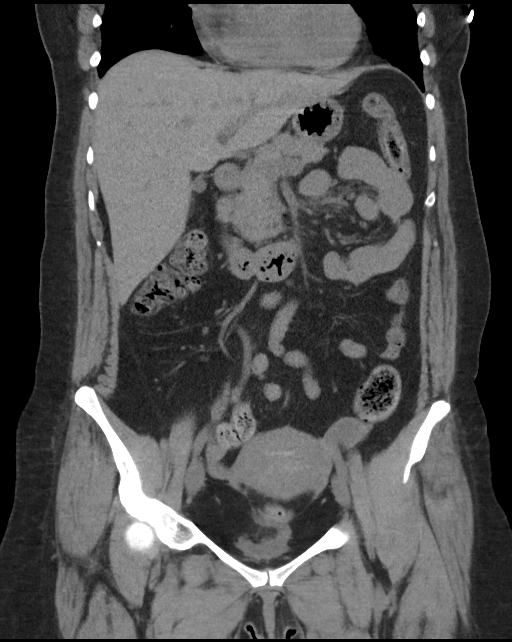
[im 56/101  soft-tissue]
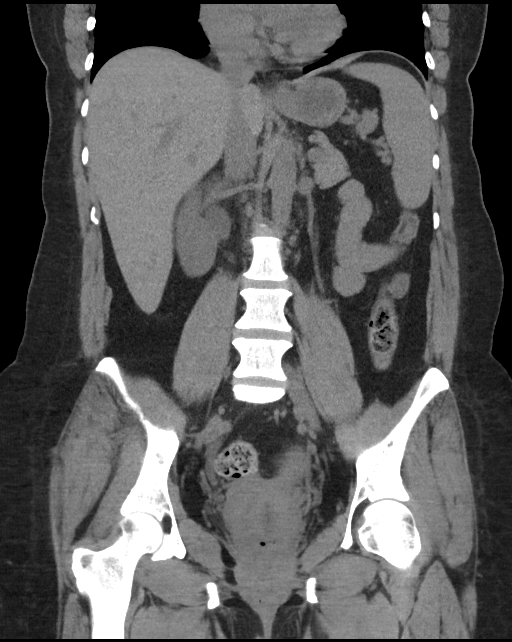

[16 of 46 positions shown; findings below may reference images not displayed]

FINDINGS: Lower chest:  Negative.

Hepatobiliary: No focal liver abnormality.No evidence of biliary
obstruction or stone.

Pancreas: Unremarkable.

Spleen: Unremarkable.

Adrenals/Urinary Tract: Negative adrenals. Moderate right
hydroureteronephrosis secondary to a 3 mm stone in the distal right
ureter, within 2 cm of the UVJ. Two punctate right renal calculi.
Mild right renal expansion and low-density. No left urolithiasis.
Unremarkable bladder.

Stomach/Bowel:  No obstruction. No appendicitis.

Vascular/Lymphatic: No acute vascular abnormality. No mass or
adenopathy.

Reproductive:IUD in place. Presumed dominant follicle on the left.
Myometrial mass at the fundus measuring 3 cm .

Other: No ascites or pneumoperitoneum.

Musculoskeletal: No acute abnormalities.  L4-5 disc bulging.
IMPRESSION: 1. Obstructing 3 mm stone in the distal right ureter.
2. Two punctate right renal calculi.
3. 3 cm fundal fibroid.

## 2020-07-08 ENCOUNTER — Emergency Department (HOSPITAL_BASED_OUTPATIENT_CLINIC_OR_DEPARTMENT_OTHER)
Admission: EM | Admit: 2020-07-08 | Discharge: 2020-07-08 | Disposition: A | Discharge status: Home / Self Care | Payer: BC Managed Care – PPO | Attending: Emergency Medicine | Admitting: Emergency Medicine

## 2020-07-08 ENCOUNTER — Emergency Department (HOSPITAL_BASED_OUTPATIENT_CLINIC_OR_DEPARTMENT_OTHER): Payer: BC Managed Care – PPO

## 2020-07-08 ENCOUNTER — Encounter (HOSPITAL_BASED_OUTPATIENT_CLINIC_OR_DEPARTMENT_OTHER): Payer: Self-pay | Admitting: Emergency Medicine

## 2020-07-08 ENCOUNTER — Other Ambulatory Visit: Payer: Self-pay

## 2020-07-08 DIAGNOSIS — M542 Cervicalgia: Secondary | ICD-10-CM | POA: Insufficient documentation

## 2020-07-08 DIAGNOSIS — M25512 Pain in left shoulder: Secondary | ICD-10-CM | POA: Insufficient documentation

## 2020-07-08 DIAGNOSIS — Y9352 Activity, horseback riding: Secondary | ICD-10-CM | POA: Diagnosis not present

## 2020-07-08 DIAGNOSIS — S0990XA Unspecified injury of head, initial encounter: Secondary | ICD-10-CM | POA: Insufficient documentation

## 2020-07-08 DIAGNOSIS — M25511 Pain in right shoulder: Secondary | ICD-10-CM

## 2020-07-08 NOTE — ED Triage Notes (Signed)
Pt reports fall from horse , landed right side , neck and right side head pain. denies loc. No vision changes, slightly dizzy today per pt. denies nausea at this time. Hx migraine.

## 2020-07-08 NOTE — ED Provider Notes (Signed)
[facility] EMERGENCY DEPARTMENT Provider Note   CSN: [id number] Arrival date & time: 07/08/20  1043     History Chief Complaint  Patient presents with  . Fall    Victoria Salazar is a 49 y.o. female.  Patient presents to the emergency department for evaluation of headache, right-sided neck pain, and right shoulder pain starting acutely after an injury yesterday at around 6 PM.  Patient was on a horse when it reared back.  Patient was thrown from the horse and landed on her right side.  She was hit in the right neck by the horse.  Initially she felt okay but about an hour later, on her drive home, she developed a migraine with aura.  This persisted this morning and she took Tylenol.  She is concerned about a concussion.  No reported nausea or vomiting.  No weakness, numbness, or tingling in the arms or the legs.  Right shoulder is sore and pain is worse with movement.  No jaw or facial pain.  No other injuries reported.        Past Medical History:  Diagnosis Date  . Anemia   . Migraine   . PCOS (polycystic ovarian syndrome)     There are no problems to display for this patient.   History reviewed. No pertinent surgical history.   OB History   No obstetric history on file.     No family history on file.  Social History   Tobacco Use  . Smoking status: Never Smoker  . Smokeless tobacco: Never Used  Substance Use Topics  . Alcohol use: No  . Drug use: No    Home Medications Prior to Admission medications   Medication Sig Start Date End Date Taking? Authorizing Provider  escitalopram (LEXAPRO) 10 MG tablet Take 10 mg by mouth daily.    [provider]  Fe Cbn-Fe Gluc-FA-B12-C-DSS (FERRALET 90 PO) Take by mouth.    [provider]  metFORMIN (GLUCOPHAGE) 1000 MG tablet Take 1,000 mg by mouth 2 (two) times daily with a meal.    [provider]  ondansetron (ZOFRAN-ODT) 4 MG disintegrating tablet Take 1 tablet (4 mg total)  by mouth every 8 (eight) hours as needed for nausea or vomiting. 03/20/17   Benjiman Core, MD  oxyCODONE-acetaminophen (PERCOCET/ROXICET) 5-325 MG tablet Take 1-2 tablets by mouth every 6 (six) hours as needed for severe pain. 03/20/17   Benjiman Core, MD    Allergies    Sulfa antibiotics  Review of Systems   Review of Systems  Constitutional: Negative for fatigue.  HENT: Negative for tinnitus.   Eyes: Negative for photophobia, pain and visual disturbance.  Respiratory: Negative for shortness of breath.   Cardiovascular: Negative for chest pain.  Gastrointestinal: Negative for nausea and vomiting.  Musculoskeletal: Positive for arthralgias, myalgias and neck pain. Negative for back pain and gait problem.  Skin: Negative for wound.  Neurological: Positive for headaches. Negative for dizziness, weakness, light-headedness and numbness.  Psychiatric/Behavioral: Negative for confusion and decreased concentration.    Physical Exam Updated Vital Signs BP (!) 143/76   Pulse 84   Temp 98.8 F (37.1 C) (Oral)   Resp 18   Ht 5\' 5"  (1.651 m)   Wt 77.1 kg   SpO2 98%   BMI 28.29 kg/m   Physical Exam Vitals and nursing note reviewed.  Constitutional:      Appearance: She is well-developed.  HENT:     Head: Normocephalic and atraumatic. No raccoon eyes or  Battle's sign.     Right Ear: Tympanic membrane, ear canal and external ear normal. No hemotympanum.     Left Ear: Tympanic membrane, ear canal and external ear normal. No hemotympanum.     Nose: Nose normal.     Mouth/Throat:     Pharynx: Uvula midline.  Eyes:     General: Lids are normal.     Extraocular Movements:     Right eye: No nystagmus.     Left eye: No nystagmus.     Conjunctiva/sclera: Conjunctivae normal.     Pupils: Pupils are equal, round, and reactive to light.     Comments: No visible hyphema noted  Cardiovascular:     Rate and Rhythm: Normal rate and regular rhythm.  Pulmonary:     Effort: Pulmonary  effort is normal.     Breath sounds: Normal breath sounds.  Abdominal:     Palpations: Abdomen is soft.     Tenderness: There is no abdominal tenderness.  Musculoskeletal:     Right shoulder: Tenderness present. No bony tenderness. Normal range of motion.     Right upper arm: No swelling or tenderness.     Cervical back: Normal range of motion and neck supple. Tenderness present. No bony tenderness. Normal range of motion.     Thoracic back: No tenderness or bony tenderness.     Lumbar back: No tenderness or bony tenderness.       Back:  Skin:    General: Skin is warm and dry.  Neurological:     Mental Status: She is alert and oriented to person, place, and time.     GCS: GCS eye subscore is 4. GCS verbal subscore is 5. GCS motor subscore is 6.     Cranial Nerves: No cranial nerve deficit.     Sensory: No sensory deficit.     Coordination: Coordination normal.     Deep Tendon Reflexes: Reflexes are normal and symmetric.     ED Results / Procedures / Treatments   Labs (all labs ordered are listed, but only abnormal results are displayed) Labs Reviewed - No data to display  EKG None  Radiology No results found.  Procedures Procedures (including critical care time)  Medications Ordered in ED Medications - No data to display  ED Course  I have reviewed the triage vital signs and the nursing notes.  Pertinent labs & imaging results that were available during my care of the patient were reviewed by me and considered in my medical decision making (see chart for details).  Patient seen and examined. Work-up initiated. CT head, neck, R shoulder ordered. Good movement of neck and shoulder.   Vital signs reviewed and are as follows: BP (!) 143/76   Pulse 84   Temp 98.8 F (37.1 C) (Oral)   Resp 18   Ht 5\' 5"  (1.651 m)   Wt 77.1 kg   SpO2 98%   BMI 28.29 kg/m   12:03 PM patient continues to do well.  Updated on results.  Plan for discharged home.  Discussed concussion  signs and symptoms and need to follow-up if these are not improving.  Discussed rest if activities make the symptoms worse.  Discussed use of Tylenol, NSAIDs, ice and heat on any sore muscle areas.  Encouraged PCP follow-up in the next 3 days if not improving.     MDM Rules/Calculators/A&P  Head injury: Normal neurologic exam.  CT imaging of the head and cervical spine were negative.  Possible mild concussion, however patient is tolerating well and looks well.  Minimally symptomatic at this point.  Headache controlled with Tylenol.  Right shoulder pain: X-rays negative.  Suspect contusion.  Encourage PCP follow-up as needed.  Upper extremity is neurovascularly intact.   Final Clinical Impression(s) / ED Diagnoses Final diagnoses:  Injury of head, initial encounter  Neck pain  Acute pain of right shoulder  Animal-rider injured by fall from or being thrown from horse in noncollision accident, initial encounter    Rx / DC Orders ED Discharge Orders    None       Renne Crigler, PA-C 07/08/20 1204    Vanetta Mulders, MD 07/11/20 [phone]

## 2020-07-08 NOTE — Discharge Instructions (Signed)
Please read and follow all provided instructions.  Your diagnoses today include:  1. Injury of head, initial encounter   2. Neck pain   3. Acute pain of right shoulder   4. Animal-rider injured by fall from or being thrown from horse in noncollision accident, initial encounter     Tests performed today include:  CT scan of your head and cervical spine that did not show any serious injury.  X-ray of your shoulder that does not show any fracture  Vital signs. See below for your results today.   Medications prescribed:  Please use over-the-counter NSAID medications (ibuprofen, naproxen) as directed on the packaging for pain.   Take any prescribed medications only as directed.  Home care instructions:  Follow any educational materials contained in this packet.  BE VERY CAREFUL not to take multiple medicines containing Tylenol (also called acetaminophen). Doing so can lead to an overdose which can damage your liver and cause liver failure and possibly death.   Follow-up instructions: Please follow-up with your primary care provider in the next 3 days for further evaluation of your symptoms if not improved.   Return instructions:  SEEK IMMEDIATE MEDICAL ATTENTION IF:  There is confusion or drowsiness (although children frequently become drowsy after injury).   You cannot awaken the injured person.   You have more than one episode of vomiting.   You notice dizziness or unsteadiness which is getting worse, or inability to walk.   You have convulsions or unconsciousness.   You experience severe, persistent headaches not relieved by Tylenol.  You cannot use arms or legs normally.   There are changes in pupil sizes. (This is the black center in the colored part of the eye)   There is clear or bloody discharge from the nose or ears.   You have change in speech, vision, swallowing, or understanding.   Localized weakness, numbness, tingling, or change in bowel or bladder  control.  You have any other emergent concerns.  Additional Information: You have had a head injury which does not appear to require admission at this time.  Your vital signs today were: BP (!) 143/76   Pulse 84   Temp 98.8 F (37.1 C) (Oral)   Resp 18   Ht 5\' 5"  (1.651 m)   Wt 77.1 kg   SpO2 98%   BMI 28.29 kg/m  If your blood pressure (BP) was elevated above 135/85 this visit, please have this repeated by your doctor within one month. --------------

## 2021-11-02 IMAGING — CR DG SHOULDER 2+V*R*
3 series · 3 of 3 positions shown · non-contrast
Comparison: 07/14/2019

CLINICAL DATA: Fall from horse with pain.

EXAM:
RIGHT SHOULDER - 2+ VIEW

[w shoulder grashey right]
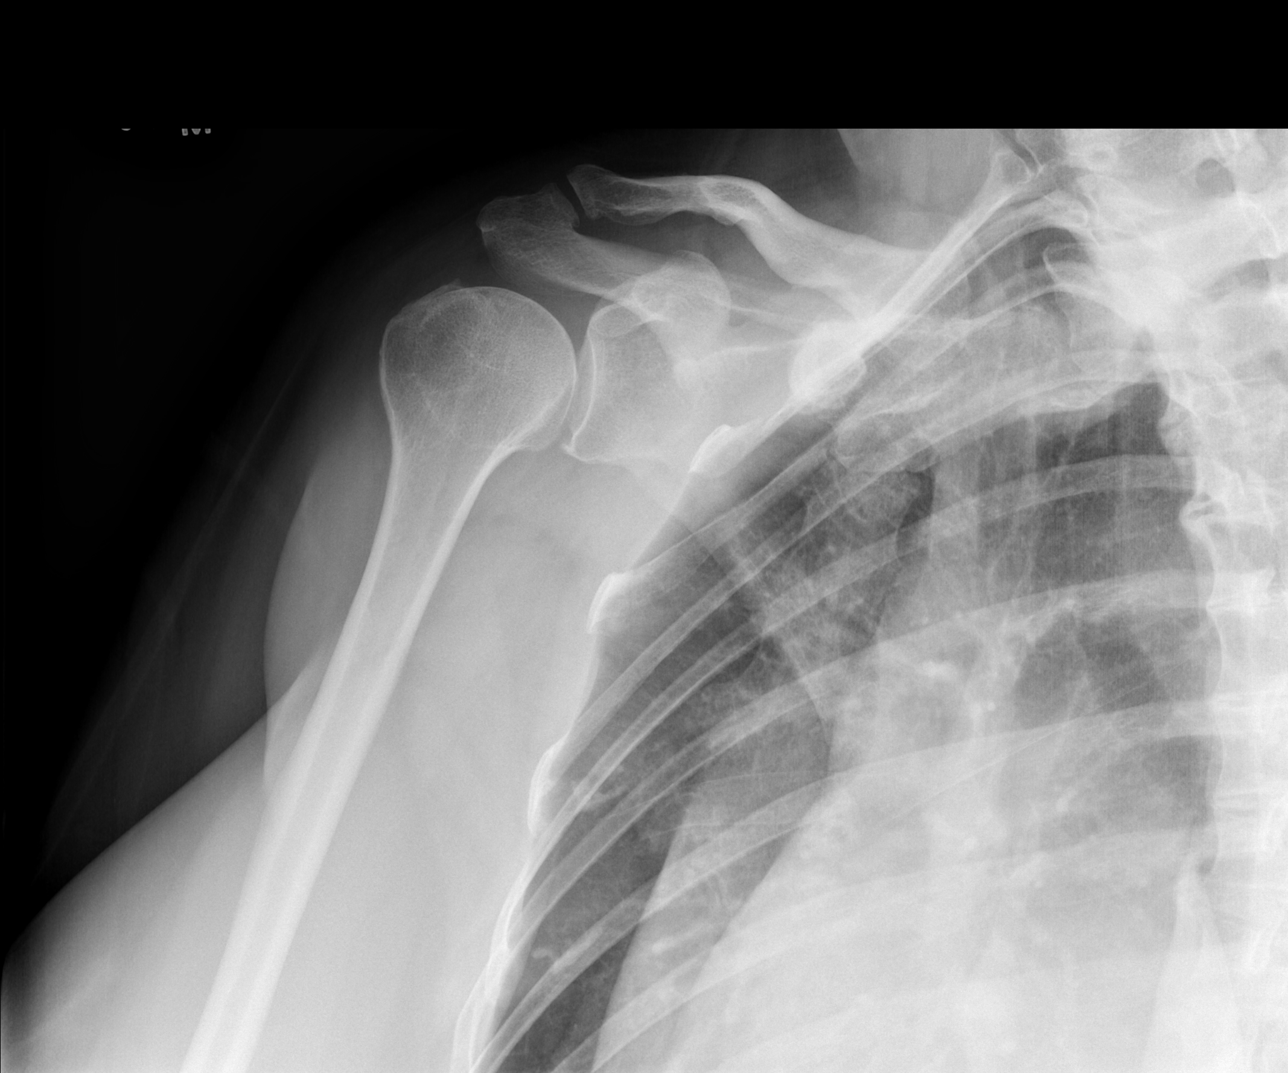

[w shoulder y view right]
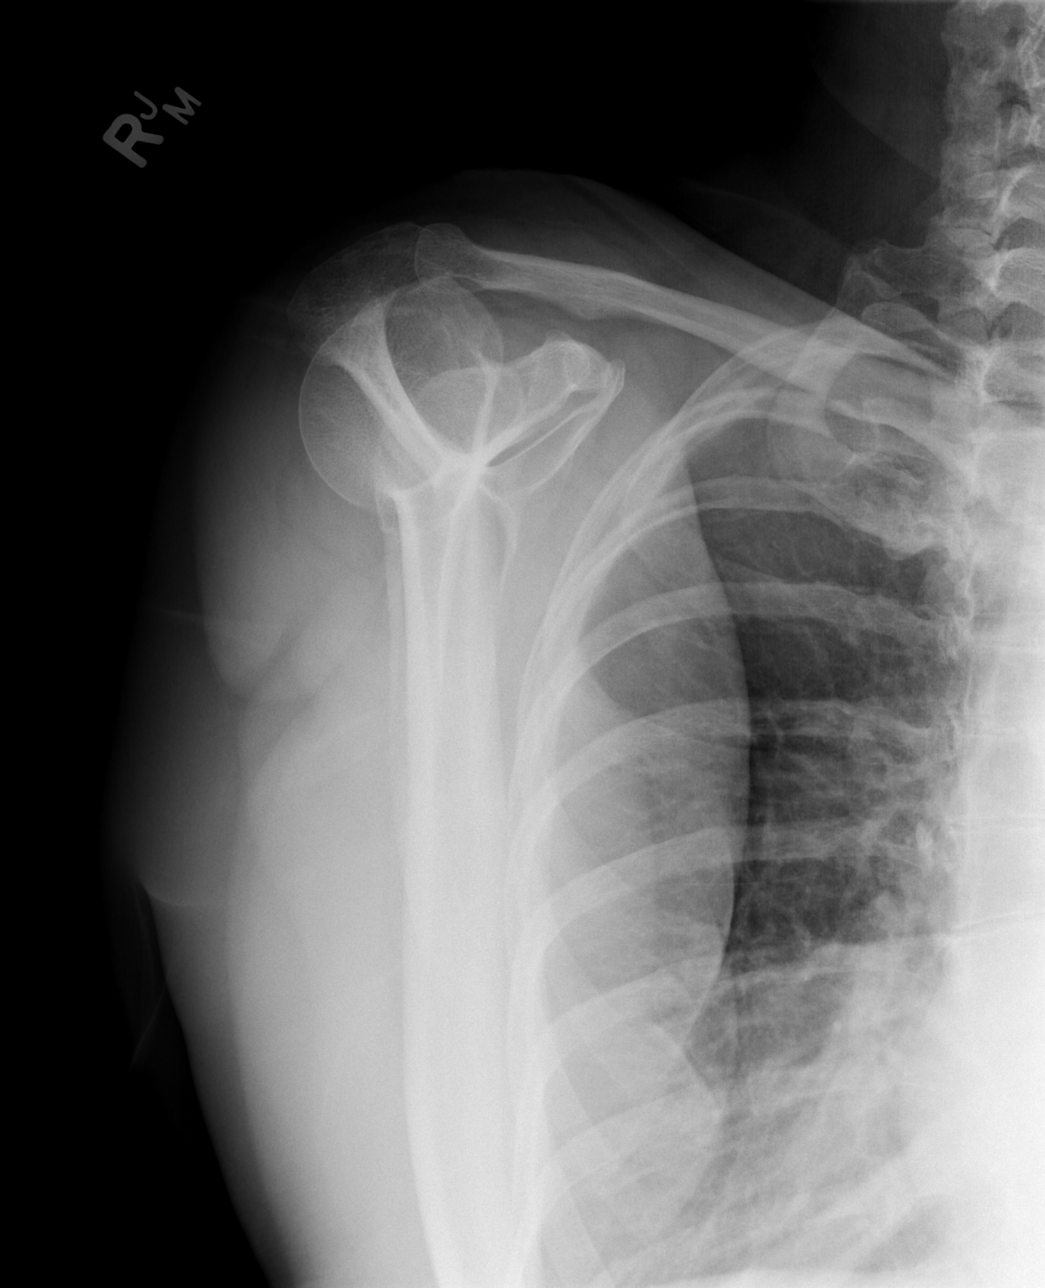

[w shoulder axillary right *]
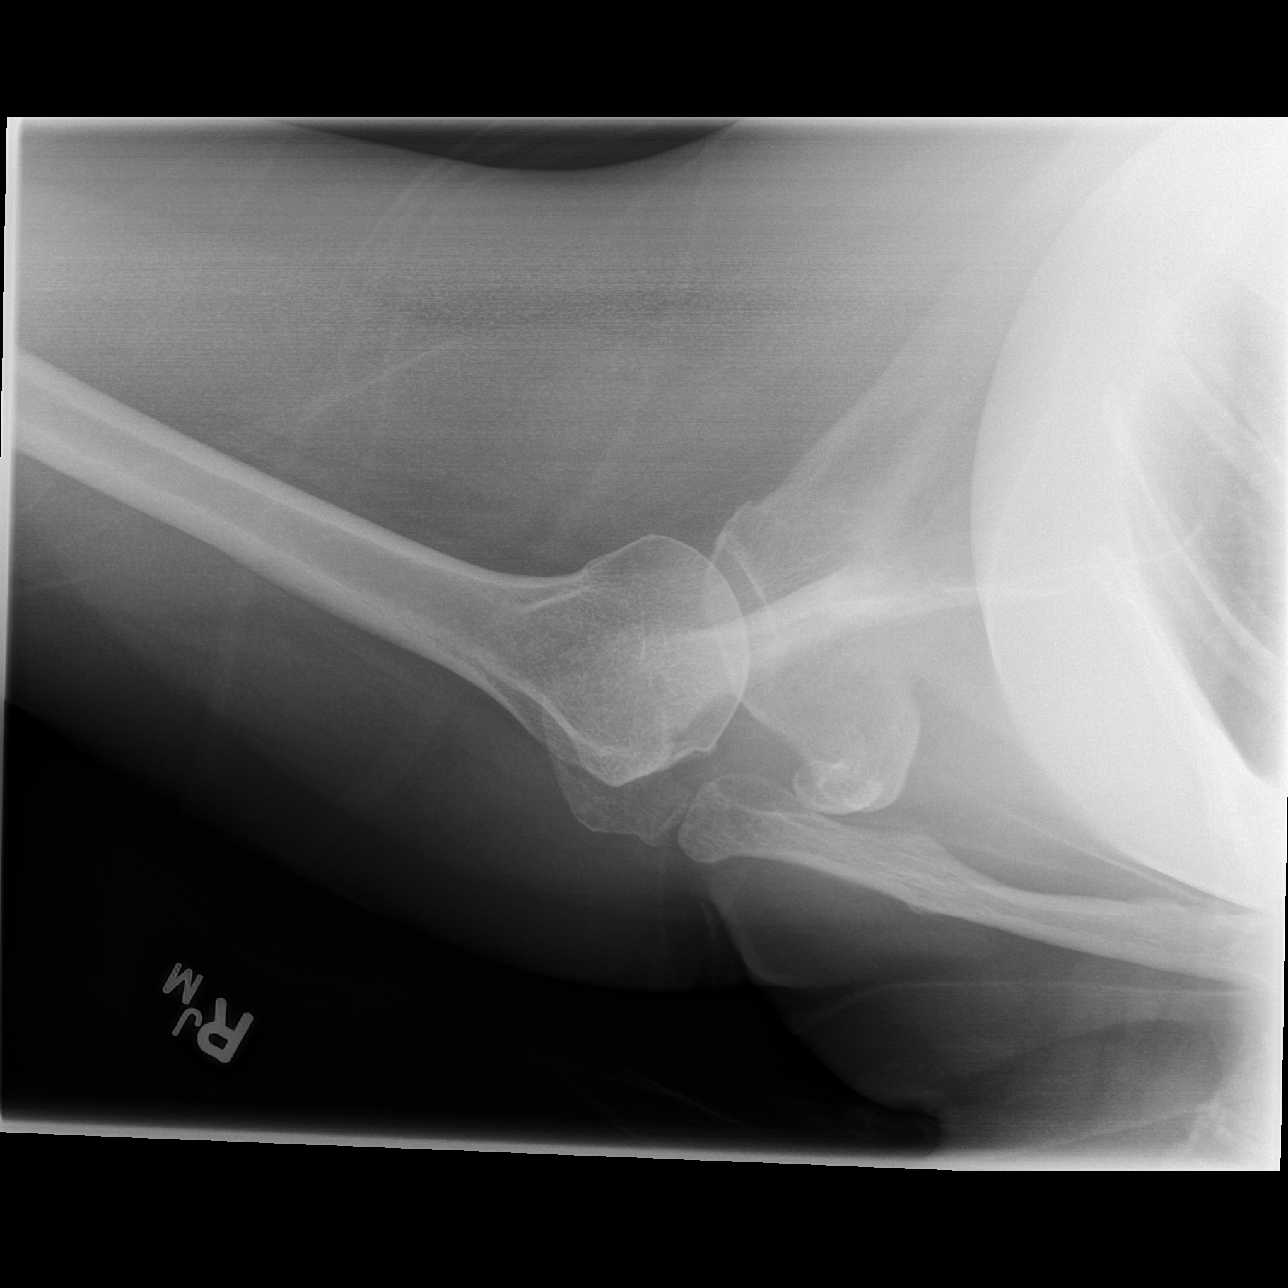

[3 of 3 positions shown; findings below may reference images not displayed]

FINDINGS: There is no evidence of fracture or dislocation. Minor spurring
about the glenohumeral joint.
IMPRESSION: Negative for fracture or dislocation.
